# Patient Record
Sex: Female | Born: 1990 | Race: Black or African American | Hispanic: No | Marital: Married
Health system: Southern US, Community
[De-identification: ages and names within clinical notes are randomized; demographics above are authoritative.]

---

## 2020-04-27 ENCOUNTER — Emergency Department (HOSPITAL_COMMUNITY)
Admission: EM | Admit: 2020-04-27 | Discharge: 2020-04-27 | Disposition: A | Discharge status: Home / Self Care | Attending: Emergency Medicine | Admitting: Emergency Medicine

## 2020-04-27 ENCOUNTER — Other Ambulatory Visit: Payer: Self-pay

## 2020-04-27 ENCOUNTER — Encounter (HOSPITAL_COMMUNITY): Payer: Self-pay | Admitting: Emergency Medicine

## 2020-04-27 ENCOUNTER — Emergency Department (HOSPITAL_COMMUNITY)

## 2020-04-27 DIAGNOSIS — Y929 Unspecified place or not applicable: Secondary | ICD-10-CM | POA: Insufficient documentation

## 2020-04-27 DIAGNOSIS — Y999 Unspecified external cause status: Secondary | ICD-10-CM | POA: Insufficient documentation

## 2020-04-27 DIAGNOSIS — S40021A Contusion of right upper arm, initial encounter: Secondary | ICD-10-CM

## 2020-04-27 DIAGNOSIS — R42 Dizziness and giddiness: Secondary | ICD-10-CM | POA: Diagnosis not present

## 2020-04-27 DIAGNOSIS — Y939 Activity, unspecified: Secondary | ICD-10-CM | POA: Insufficient documentation

## 2020-04-27 DIAGNOSIS — S39012A Strain of muscle, fascia and tendon of lower back, initial encounter: Secondary | ICD-10-CM | POA: Insufficient documentation

## 2020-04-27 DIAGNOSIS — S161XXA Strain of muscle, fascia and tendon at neck level, initial encounter: Secondary | ICD-10-CM | POA: Diagnosis not present

## 2020-04-27 DIAGNOSIS — R519 Headache, unspecified: Secondary | ICD-10-CM | POA: Diagnosis not present

## 2020-04-27 DIAGNOSIS — T07XXXA Unspecified multiple injuries, initial encounter: Secondary | ICD-10-CM

## 2020-04-27 LAB — I-STAT BETA HCG BLOOD, ED (MC, WL, AP ONLY): I-stat hCG, quantitative: <5 m[IU]/mL (ref <5)

## 2020-04-27 MED ORDER — IBUPROFEN 600 MG PO TABS
600.0000 mg | ORAL_TABLET | Freq: Three times a day (TID) | ORAL | 0 refills | Status: AC | PRN
Start: 2020-04-27 — End: ?

## 2020-04-27 MED ORDER — IBUPROFEN 400 MG PO TABS
600.0000 mg | ORAL_TABLET | Freq: Once | ORAL | Status: AC
  Administered 2020-04-27: 600 mg via ORAL
  Filled 2020-04-27: qty 1

## 2020-04-27 MED ORDER — METHOCARBAMOL 500 MG PO TABS: 500.0000 mg | ORAL_TABLET | Freq: Three times a day (TID) | ORAL | 0 refills | Status: AC | PRN

## 2020-04-27 MED ORDER — METHOCARBAMOL 500 MG PO TABS
500.0000 mg | ORAL_TABLET | Freq: Once | ORAL | Status: AC
  Administered 2020-04-27: 500 mg via ORAL
  Filled 2020-04-27: qty 1

## 2020-04-27 MED ORDER — IBUPROFEN 400 MG PO TABS
600.0000 mg | ORAL_TABLET | Freq: Once | ORAL | Status: AC
  Administered 2020-04-27: 600 mg via ORAL

## 2020-04-27 MED ORDER — HYDROCODONE-ACETAMINOPHEN 5-325 MG PO TABS
1.0000 | ORAL_TABLET | Freq: Once | ORAL | Status: AC
  Administered 2020-04-27: 1 via ORAL
  Filled 2020-04-27: qty 1
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
## 2020-04-28 ENCOUNTER — Encounter (HOSPITAL_COMMUNITY): Payer: Self-pay | Admitting: Emergency Medicine

## 2022-01-16 ENCOUNTER — Encounter (HOSPITAL_COMMUNITY): Payer: Self-pay

## 2022-01-16 ENCOUNTER — Emergency Department (HOSPITAL_COMMUNITY)
Admission: EM | Admit: 2022-01-16 | Discharge: 2022-01-17 | Disposition: A | Discharge status: Home / Self Care | Attending: Emergency Medicine | Admitting: Emergency Medicine

## 2022-01-16 ENCOUNTER — Other Ambulatory Visit: Payer: Self-pay

## 2022-01-16 ENCOUNTER — Emergency Department (HOSPITAL_COMMUNITY)

## 2022-01-16 DIAGNOSIS — E039 Hypothyroidism, unspecified: Secondary | ICD-10-CM | POA: Diagnosis not present

## 2022-01-16 DIAGNOSIS — E119 Type 2 diabetes mellitus without complications: Secondary | ICD-10-CM | POA: Insufficient documentation

## 2022-01-16 DIAGNOSIS — R079 Chest pain, unspecified: Secondary | ICD-10-CM

## 2022-01-16 DIAGNOSIS — J209 Acute bronchitis, unspecified: Secondary | ICD-10-CM | POA: Insufficient documentation

## 2022-01-16 DIAGNOSIS — E876 Hypokalemia: Secondary | ICD-10-CM | POA: Insufficient documentation

## 2022-01-16 DIAGNOSIS — D649 Anemia, unspecified: Secondary | ICD-10-CM | POA: Diagnosis not present

## 2022-01-16 LAB — CBC
HCT: 31.7 % — ABNORMAL LOW (ref 36.0–46.0)
Hemoglobin: 10.5 g/dL — ABNORMAL LOW (ref 12.0–15.0)
MCH: 29.9 pg (ref 26.0–34.0)
MCHC: 33.1 g/dL (ref 30.0–36.0)
MCV: 90.3 fL (ref 80.0–100.0)
Platelets: 151 10*3/uL (ref 150–400)
RBC: 3.51 MIL/uL — ABNORMAL LOW (ref 3.87–5.11)
RDW: 13.1 % (ref 11.5–15.5)
WBC: 7.3 10*3/uL (ref 4.0–10.5)
nRBC: 0 % (ref 0.0–0.2)

## 2022-01-16 LAB — BASIC METABOLIC PANEL
Anion gap: 4 — ABNORMAL LOW (ref 5–15)
BUN: 12 mg/dL (ref 6–20)
CO2: 23 mmol/L (ref 22–32)
Calcium: 8.5 mg/dL — ABNORMAL LOW (ref 8.9–10.3)
Chloride: 111 mmol/L (ref 98–111)
Creatinine, Ser: 0.63 mg/dL (ref 0.44–1.00)
GFR, Estimated: >60 mL/min (ref >60)
Glucose, Bld: 111 mg/dL — ABNORMAL HIGH (ref 70–99)
Potassium: 3.4 mmol/L — ABNORMAL LOW (ref 3.5–5.1)
Sodium: 138 mmol/L (ref 135–145)

## 2022-01-16 LAB — PROTIME-INR
INR: 1 (ref 0.8–1.2)
Prothrombin Time: 12.7 seconds (ref 11.4–15.2)

## 2022-01-16 LAB — I-STAT BETA HCG BLOOD, ED (MC, WL, AP ONLY): I-stat hCG, quantitative: <5 m[IU]/mL (ref <5)

## 2022-01-16 LAB — D-DIMER, QUANTITATIVE: D-Dimer, Quant: 0.6 ug/mL-FEU — ABNORMAL HIGH (ref 0.00–0.50)

## 2022-01-16 LAB — TROPONIN I (HIGH SENSITIVITY): Troponin I (High Sensitivity): <2 ng/L (ref <18)

## 2022-01-16 MED ORDER — KETOROLAC TROMETHAMINE 60 MG/2ML IM SOLN
60.0000 mg | Freq: Once | INTRAMUSCULAR | Status: AC
  Administered 2022-01-17: 60 mg via INTRAMUSCULAR
  Filled 2022-01-16: qty 2

## 2022-01-16 MED ORDER — POTASSIUM CHLORIDE CRYS ER 20 MEQ PO TBCR
40.0000 meq | EXTENDED_RELEASE_TABLET | Freq: Once | ORAL | Status: AC
  Administered 2022-01-17: 40 meq via ORAL
  Filled 2022-01-16: qty 2

## 2022-01-16 NOTE — ED Provider Notes (Signed)
?[facility]-EMERGENCY DEPT ?Provider Note ? ? ?CSN: [id number] ?Arrival date & time: 01/16/22  2221 ? ?  ? ?History ? ?Chief Complaint  ?Patient presents with  ? Chest Pain  ? ? ?Kristin Goodman is a 31 y.o. female. ? ?The history is provided by the patient.  ?Chest Pain ?She has history of diet-controlled diabetes, hypothyroidism and comes in complaining of sharp left-sided chest pain which started this afternoon.  Pain is worse with movement and worse with taking deep breath.  There is associated dyspnea but no nausea or diaphoresis.  She denies any cough or fever.  A coworker gave her a dose of aspirin which did not help.  She is a non-smoker and denies history of hypertension or hyperlipidemia.  She denies history of travel, recent surgery, cancer, DVT, PE.  She is given a medicine for fibroid but she is not sure what it is.  She also states that she was exposed to someone with strep this past week but she denies a sore throat. ?  ?Home Medications ?Prior to Admission medications   ?Medication Sig Start Date End Date Taking? Authorizing Provider  ?ibuprofen (ADVIL) 600 MG tablet Take 1 tablet (600 mg total) by mouth every 8 (eight) hours as needed for moderate pain. 04/27/20   Terrilee FilesButler, Michael C, MD  ?methocarbamol (ROBAXIN) 500 MG tablet Take 1 tablet (500 mg total) by mouth every 8 (eight) hours as needed for muscle spasms. 04/27/20   Terrilee FilesButler, Michael C, MD  ?   ? ?Allergies    ?Penicillins and Percocet [oxycodone-acetaminophen]   ? ?Review of Systems   ?Review of Systems  ?Cardiovascular:  Positive for chest pain.  ?All other systems reviewed and are negative. ? ?Physical Exam ?Updated Vital Signs ?BP (!) 111/99   Pulse 77   Temp 99.3 ?F (37.4 ?C) (Oral)   Resp 18   Ht 5\' 5"  (1.651 m)   Wt 65.8 kg   SpO2 100%   BMI 24.13 kg/m?  ?Physical Exam ?Vitals and nursing note reviewed.  ?31 year old female, resting comfortably and in no acute distress. Vital signs are normal. Oxygen saturation is  100%, which is normal. ?Head is normocephalic and atraumatic. PERRLA, EOMI. Oropharynx is clear. ?Neck is nontender and supple without adenopathy or JVD. ?Back is nontender and there is no CVA tenderness. ?Lungs are clear without rales, wheezes, or rhonchi. ?Chest is moderately tender diffusely throughout the left anterior chest wall.  There is no crepitus. ?Heart has regular rate and rhythm without murmur. ?Abdomen is soft, flat, with mild epigastric tenderness. ?Extremities have no cyanosis or edema, full range of motion is present. ?Skin is warm and dry without rash. ?Neurologic: Mental status is normal, cranial nerves are intact, moves all extremities equally. ? ?ED Results / Procedures / Treatments   ?Labs ?(all labs ordered are listed, but only abnormal results are displayed) ?Labs Reviewed  ?BASIC METABOLIC PANEL - Abnormal; Notable for the following components:  ?    Result Value  ? Potassium 3.4 (*)   ? Glucose, Bld 111 (*)   ? Calcium 8.5 (*)   ? Anion gap 4 (*)   ? All other components within normal limits  ?CBC - Abnormal; Notable for the following components:  ? RBC 3.51 (*)   ? Hemoglobin 10.5 (*)   ? HCT 31.7 (*)   ? All other components within normal limits  ?D-DIMER, QUANTITATIVE - Abnormal; Notable for the following components:  ? D-Dimer, Quant 0.60 (*)   ?  All other components within normal limits  ?GROUP A STREP BY PCR  ?PROTIME-INR  ?I-STAT BETA HCG BLOOD, ED ([facility], [facility], [facility] ONLY)  ?TROPONIN I (HIGH SENSITIVITY)  ?TROPONIN I (HIGH SENSITIVITY)  ? ? ?EKG ?EKG Interpretation ? ?Date/Time:  Monday Jan 16 2022 22:26:49 EDT ?Ventricular Rate:  68 ?PR Interval:  177 ?QRS Duration: 73 ?QT Interval:  363 ?QTC Calculation: 386 ?R Axis:   83 ?Text Interpretation: Sinus or ectopic atrial rhythm No old tracing to compare Confirmed by Melene Plan [phone]) on 01/16/2022 10:45:20 PM ? ?Radiology ?DG Chest 2 View ? ?Result Date: 01/16/2022 ?CLINICAL DATA:  Chest pain. EXAM: CHEST - 2 VIEW COMPARISON:  None Available.  FINDINGS: The heart size and mediastinal contours are within normal limits. Both lungs are clear. The visualized skeletal structures are unremarkable. IMPRESSION: No active cardiopulmonary disease. Electronically Signed   By: Elgie Collard M.D.   On: 01/16/2022 22:47  ? ?CT Angio Chest PE W and/or Wo Contrast ? ?Result Date: 01/17/2022 ?CLINICAL DATA:  Pulmonary embolism suspected, positive D-dimer. Chest pain and shortness of breath. EXAM: CT ANGIOGRAPHY CHEST WITH CONTRAST TECHNIQUE: Multidetector CT imaging of the chest was performed using the standard protocol during bolus administration of intravenous contrast. Multiplanar CT image reconstructions and MIPs were obtained to evaluate the vascular anatomy. RADIATION DOSE REDUCTION: This exam was performed according to the departmental dose-optimization program which includes automated exposure control, adjustment of the mA and/or kV according to patient size and/or use of iterative reconstruction technique. CONTRAST:  OMNIPAQUE IOHEXOL 350 MG/ML SOLN COMPARISON:  None Available. FINDINGS: Cardiovascular: The heart is normal in size and there is no pericardial effusion. The aorta and pulmonary trunk are normal in caliber. No definite evidence of pulmonary embolism. Evaluation of the pulmonary arteries at the lung bases is limited due to respiratory motion artifact. Mediastinum/Nodes: No enlarged mediastinal, hilar, or axillary lymph nodes. Thyroid gland, trachea, and esophagus demonstrate no significant findings. Lungs/Pleura: Mild bronchial wall thickening is noted bilaterally. No consolidation, effusion, or pneumothorax. Upper Abdomen: No acute abnormality. Musculoskeletal: No acute osseous abnormality. Review of the MIP images confirms the above findings. IMPRESSION: 1. No evidence of pulmonary embolism. Evaluation of the pulmonary arteries at the lung bases is limited due to respiratory motion artifact. 2. Bronchial wall thickening bilaterally, possible  infectious or inflammatory bronchitis. Electronically Signed   By: Thornell Sartorius M.D.   On: 01/17/2022 02:46   ? ?Procedures ?Procedures  ? ? ?Medications Ordered in ED ?Medications  ?sodium chloride (PF) 0.9 % injection (has no administration in time range)  ?doxycycline (VIBRA-TABS) tablet 100 mg (has no administration in time range)  ?ketorolac (TORADOL) injection 60 mg (60 mg Intramuscular Given 01/17/22 0011)  ?potassium chloride SA (KLOR-CON M) CR tablet 40 mEq (40 mEq Oral Given 01/17/22 0015)  ?iohexol (OMNIPAQUE) 350 MG/ML injection 100 mL (100 mLs Intravenous Contrast Given 01/17/22 0224)  ? ? ?ED Course/ Medical Decision Making/ A&P ?  ?                        ?Medical Decision Making ?Amount and/or Complexity of Data Reviewed ?Labs: ordered. ?Radiology: ordered. ? ?Risk ?Prescription drug management. ? ? ?Chest pain which seems most likely to be chest wall pain.  Old records are reviewed, and the medication she is given for her uterine fibroids is norethindrone, which would put her at increased risk for pulmonary embolism, so we will screen with D-dimer.  Symptoms are very atypical for ACS.  No cough or fever to suggest pneumonia.  ECG shows no acute findings.  I have independently viewed the ECG and agree with the liters interpretation.  Chest x-ray shows no acute process.  I have independently viewed the images, and agree with the radiologist's interpretation.  Labs show mild hypokalemia and she is given a dose of oral potassium.  She is also noted to have mild anemia.  On review of lab results on Care Everywhere, hemoglobin is unchanged from prior.  She will be given a therapeutic trial of ketorolac. ? ?Following ketorolac, patient was sleeping.  However, when aroused, as she stated no improvement in pain.  Second troponin has come back normal, D-dimer has come back slightly elevated.  She will be sent for CT angiogram of the chest to rule out pulmonary embolism. ? ?CT angiogram shows no evidence of  pulmonary embolism, but there is some bronchial wall thickening consistent with an infectious etiology.  She continues to be resting comfortably, is felt to be safe for discharge.  She is discharged with prescription fo

## 2022-01-16 NOTE — ED Triage Notes (Addendum)
Patient reports chest pain for 2-3 hours that occurred suddenly. Patient originally thought it was gas or muscle pain. Patient states it hurts to talk. Patient now feels short of breath. Patient took 1 81mg  ASA ?

## 2022-01-17 ENCOUNTER — Emergency Department (HOSPITAL_COMMUNITY)

## 2022-01-17 ENCOUNTER — Encounter (HOSPITAL_COMMUNITY): Payer: Self-pay

## 2022-01-17 LAB — TROPONIN I (HIGH SENSITIVITY): Troponin I (High Sensitivity): 2 ng/L (ref <18)

## 2022-01-17 LAB — GROUP A STREP BY PCR: Group A Strep by PCR: NOT DETECTED

## 2022-01-17 MED ORDER — DOXYCYCLINE HYCLATE 100 MG PO TABS
100.0000 mg | ORAL_TABLET | Freq: Once | ORAL | Status: AC
  Administered 2022-01-17: 100 mg via ORAL
  Filled 2022-01-17: qty 1

## 2022-01-17 MED ORDER — IOHEXOL 350 MG/ML SOLN
100.0000 mL | Freq: Once | INTRAVENOUS | Status: AC | PRN
  Administered 2022-01-17: 100 mL via INTRAVENOUS

## 2022-01-17 MED ORDER — POTASSIUM CHLORIDE CRYS ER 20 MEQ PO TBCR
20.0000 meq | EXTENDED_RELEASE_TABLET | Freq: Two times a day (BID) | ORAL | 0 refills | Status: AC
Start: 2022-01-17 — End: ?

## 2022-01-17 MED ORDER — DOXYCYCLINE HYCLATE 100 MG PO CAPS: 100.0000 mg | ORAL_CAPSULE | Freq: Two times a day (BID) | ORAL | 0 refills | Status: AC

## 2022-01-17 MED ORDER — SODIUM CHLORIDE (PF) 0.9 % IJ SOLN
INTRAMUSCULAR | Status: AC
  Filled 2022-01-17: qty 50

## 2022-01-17 NOTE — ED Notes (Signed)
I provided reinforced discharge education based off of discharge instructions. Pt acknowledged and understood my education. Pt had no further questions/concerns for provider/myself. RN provided additional discharge instructions and answered pt questions. ?

## 2022-01-17 NOTE — Discharge Instructions (Signed)
Apply ice to sore areas.  Ice to be applied for 30 minutes at a time, 4 times a day. ? ?Take ibuprofen or naproxen as needed for pain.  To get additional pain relief, add acetaminophen.  When you combine acetaminophen and either ibuprofen or naproxen, you get better pain relief and you get from taking either medication by itself. ? ?Return if symptoms are getting worse. ?

## 2023-08-27 IMAGING — CT CT ANGIO CHEST
3 of 7 series · 17 of 36 positions shown · IV contrast (agent unspecified)
Comparison: None Available.

CLINICAL DATA: Pulmonary embolism suspected, positive D-dimer.
Chest pain and shortness of breath.

EXAM:
CT ANGIOGRAPHY CHEST WITH CONTRAST
TECHNIQUE: Multidetector CT imaging of the chest was performed using the
standard protocol during bolus administration of intravenous
contrast. Multiplanar CT image reconstructions and MIPs were
obtained to evaluate the vascular anatomy.

[Series 5: thins · axial · 0.62mm/px · z∈[-127,+94]mm · 12 of 263 slices shown]
[im 21/263  lung]
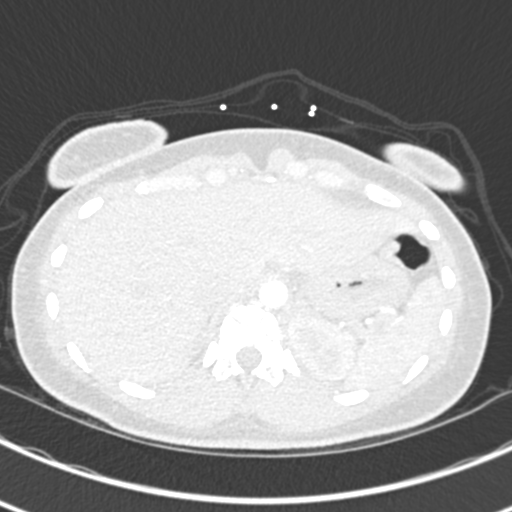
[im 41/263  mediastinal]
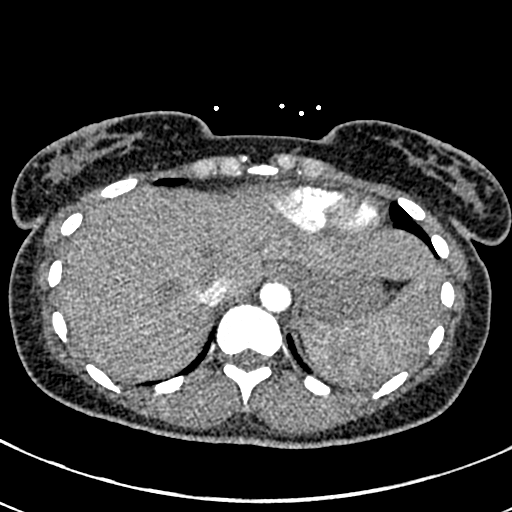
[im 61/263  lung]
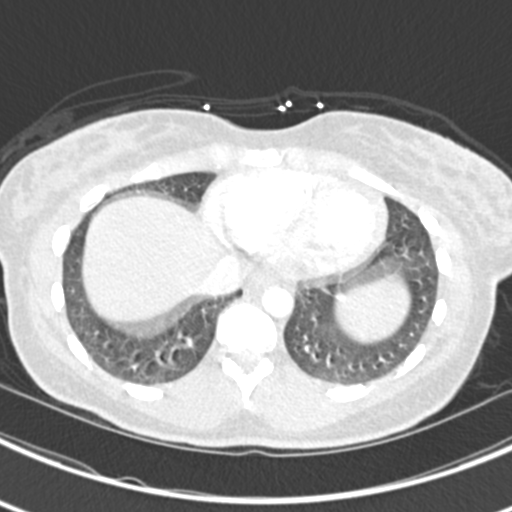
[im 81/263  mediastinal]
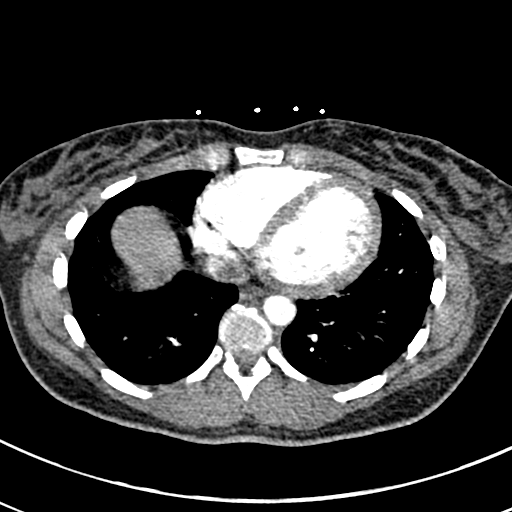
[im 101/263  lung]
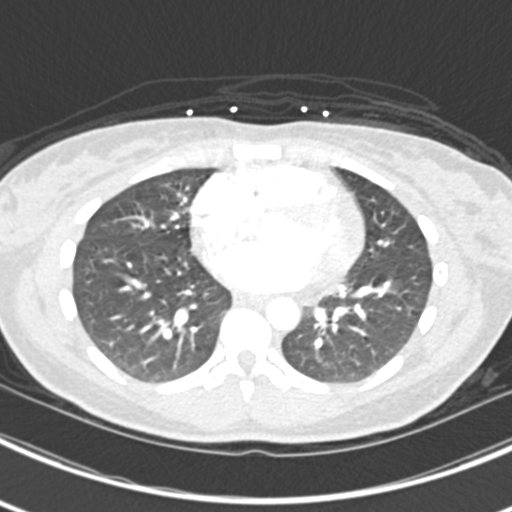
[im 121/263  mediastinal]
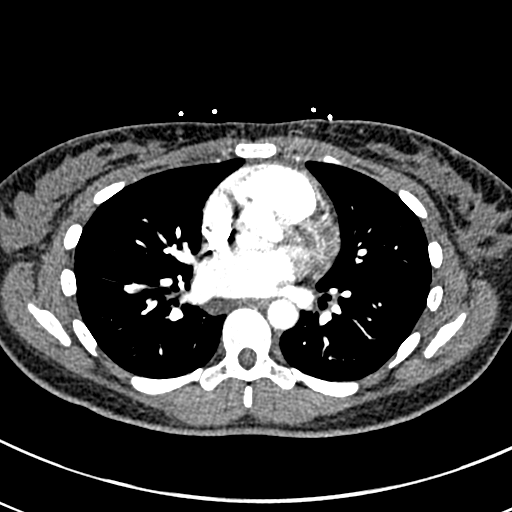
[im 142/263  lung]
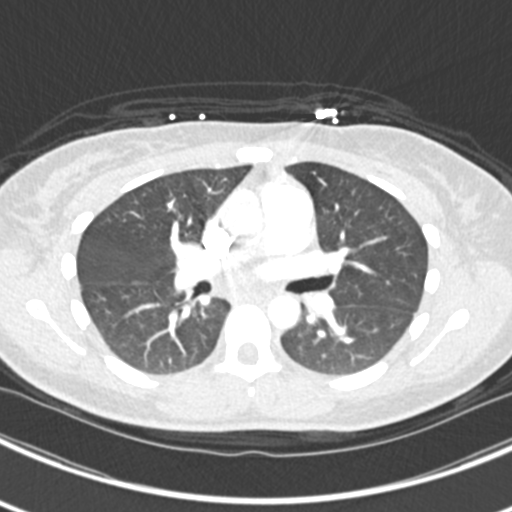
[im 162/263  mediastinal]
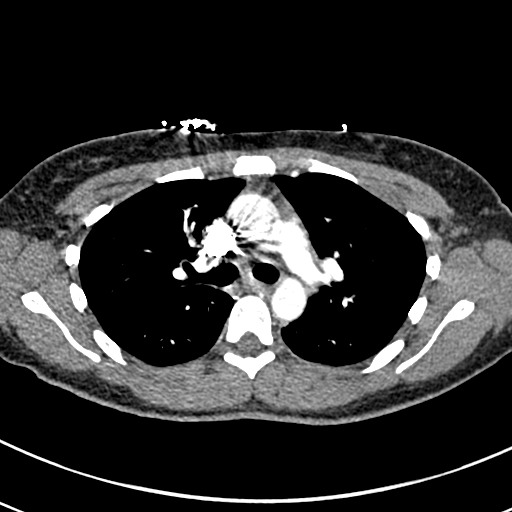
[im 182/263  lung]
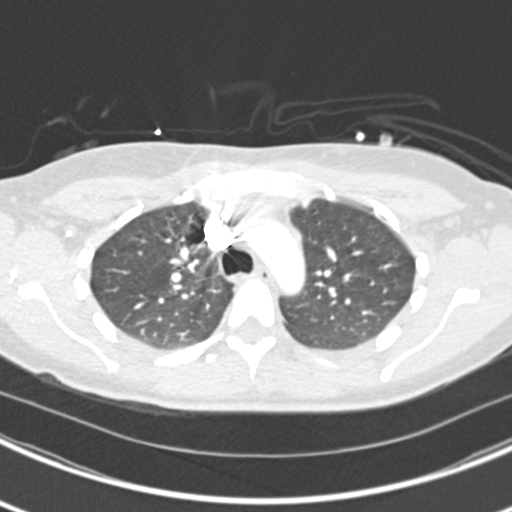
[im 202/263  mediastinal]
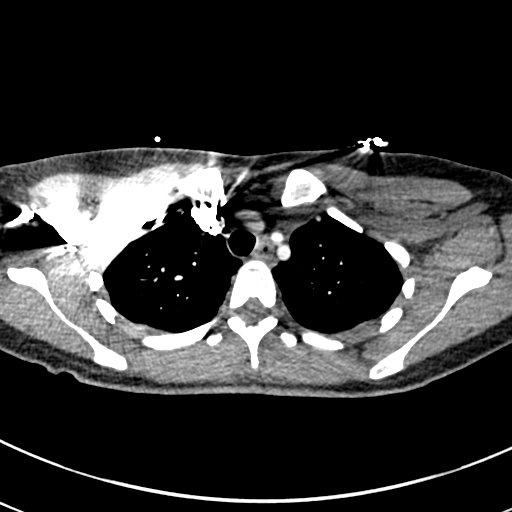
[im 222/263  lung]
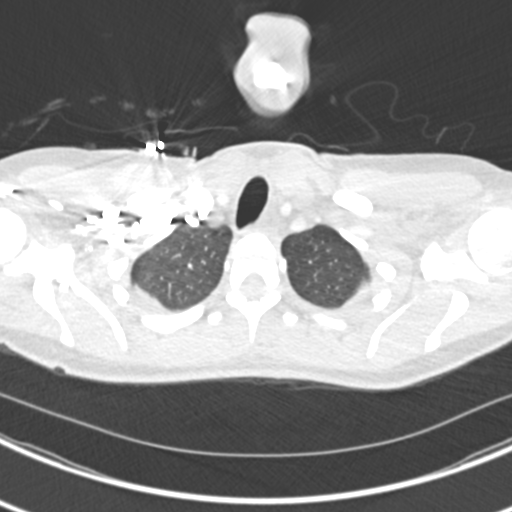
[im 242/263  mediastinal]
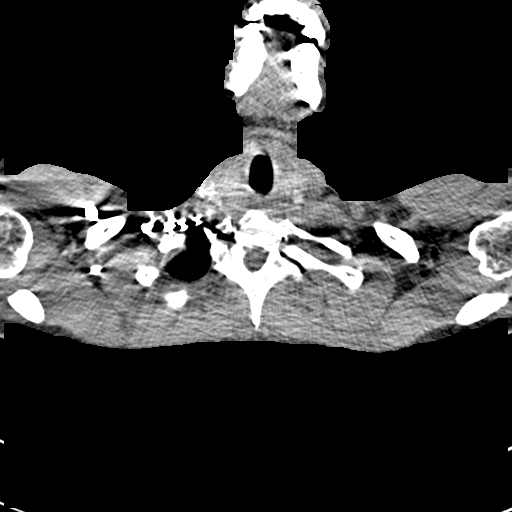

[Series 6: lung · axial · 0.62mm/px · z∈[-73,+67]mm · 4 of 118 slices shown]
[im 24/118  mediastinal]
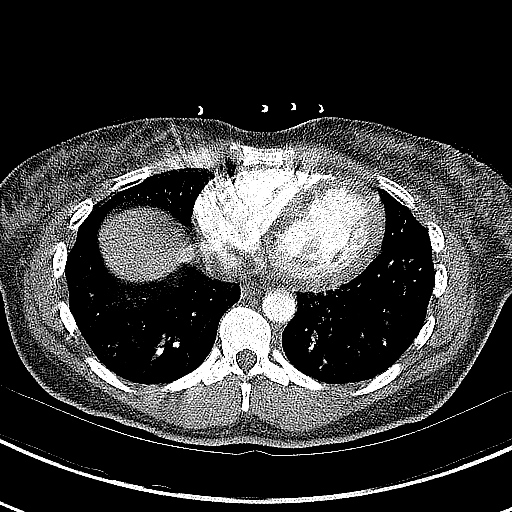
[im 47/118  mediastinal]
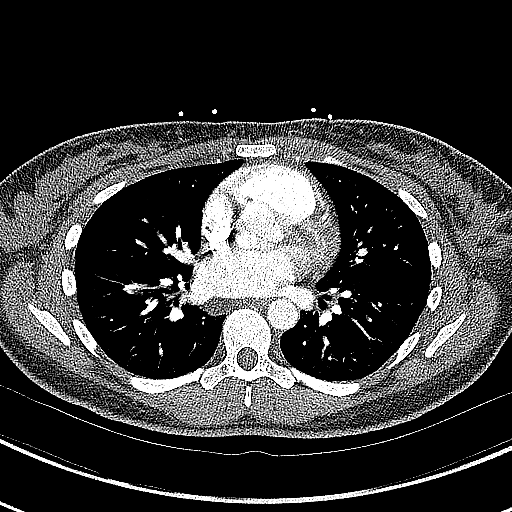
[im 71/118  mediastinal]
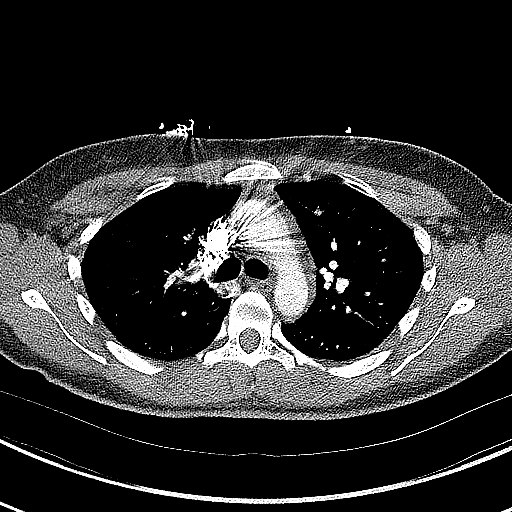
[im 94/118  mediastinal]
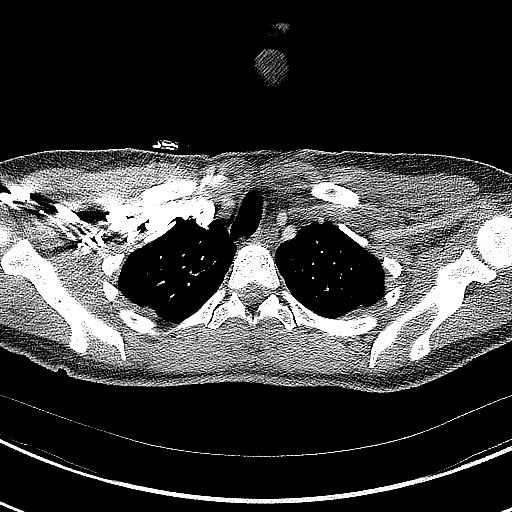

[Series 7: coronal mpr · coronal · 0.52mm/px · 1 of 98 slices shown]
[im 49/98  mediastinal]
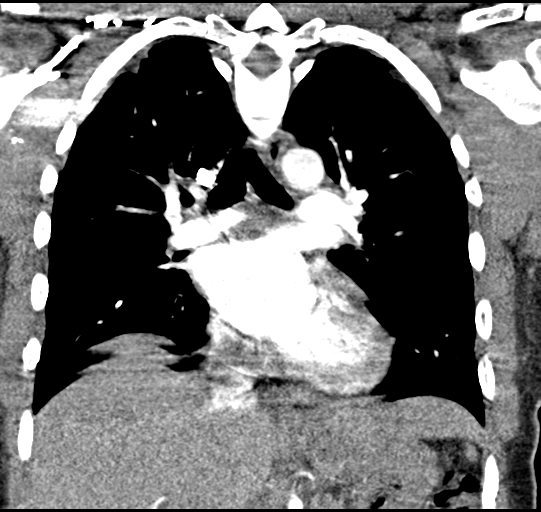

[17 of 36 positions shown; findings below may reference images not displayed]

RADIATION DOSE REDUCTION: This exam was performed according to the
departmental dose-optimization program which includes automated
exposure control, adjustment of the mA and/or kV according to
patient size and/or use of iterative reconstruction technique.

CONTRAST:  100mL OMNIPAQUE IOHEXOL 350 MG/ML SOLN
FINDINGS: Cardiovascular: The heart is normal in size and there is no
pericardial effusion. The aorta and pulmonary trunk are normal in
caliber. No definite evidence of pulmonary embolism. Evaluation of
the pulmonary arteries at the lung bases is limited due to
respiratory motion artifact.

Mediastinum/Nodes: No enlarged mediastinal, hilar, or axillary lymph
nodes. Thyroid gland, trachea, and esophagus demonstrate no
significant findings.

Lungs/Pleura: Mild bronchial wall thickening is noted bilaterally.
No consolidation, effusion, or pneumothorax.

Upper Abdomen: No acute abnormality.

Musculoskeletal: No acute osseous abnormality.

Review of the MIP images confirms the above findings.
IMPRESSION: 1. No evidence of pulmonary embolism. Evaluation of the pulmonary
arteries at the lung bases is limited due to respiratory motion
artifact.
2. Bronchial wall thickening bilaterally, possible infectious or
inflammatory bronchitis.
